# Patient Record
Sex: Female | Born: 1945 | Race: White | Hispanic: No | Marital: Single | Smoking: Never smoker
Health system: Southern US, Community
[De-identification: ages and names within clinical notes are randomized; demographics above are authoritative.]

## PROBLEM LIST (undated history)

## (undated) DIAGNOSIS — I10 Essential (primary) hypertension: Secondary | ICD-10-CM

## (undated) HISTORY — PX: ABDOMINAL HYSTERECTOMY: SHX81

---

## 2012-11-30 ENCOUNTER — Emergency Department: Payer: Self-pay | Admitting: Emergency Medicine

## 2012-11-30 LAB — BASIC METABOLIC PANEL
Anion Gap: 4 — ABNORMAL LOW (ref 7–16)
BUN: 16 mg/dL (ref 7–18)
Calcium, Total: 9.5 mg/dL (ref 8.5–10.1)
Creatinine: 0.86 mg/dL (ref 0.60–1.30)
EGFR (African American): >60
EGFR (Non-African Amer.): >60
Sodium: 141 mmol/L (ref 136–145)

## 2012-11-30 LAB — CBC
MCH: 30 pg (ref 26.0–34.0)
MCHC: 33 g/dL (ref 32.0–36.0)
MCV: 91 fL (ref 80–100)

## 2013-12-22 ENCOUNTER — Emergency Department: Payer: Self-pay | Admitting: Emergency Medicine

## 2013-12-22 LAB — URINALYSIS, COMPLETE
BILIRUBIN, UR: NEGATIVE
Blood: NEGATIVE
GLUCOSE, UR: NEGATIVE mg/dL (ref 0–75)
Ketone: NEGATIVE
LEUKOCYTE ESTERASE: NEGATIVE
Nitrite: NEGATIVE
Ph: 5 (ref 4.5–8.0)
Protein: NEGATIVE
RBC,UR: NONE SEEN /HPF (ref 0–5)
Specific Gravity: 1.016 (ref 1.003–1.030)
WBC UR: 9 /HPF (ref 0–5)

## 2013-12-22 LAB — COMPREHENSIVE METABOLIC PANEL
AST: 12 U/L — AB (ref 15–37)
Albumin: 3.6 g/dL (ref 3.4–5.0)
Alkaline Phosphatase: 50 U/L
Anion Gap: 3 — ABNORMAL LOW (ref 7–16)
BILIRUBIN TOTAL: 0.3 mg/dL (ref 0.2–1.0)
BUN: 12 mg/dL (ref 7–18)
CO2: 30 mmol/L (ref 21–32)
CREATININE: 0.96 mg/dL (ref 0.60–1.30)
Calcium, Total: 9.4 mg/dL (ref 8.5–10.1)
Chloride: 110 mmol/L — ABNORMAL HIGH (ref 98–107)
EGFR (African American): >60
Glucose: 109 mg/dL — ABNORMAL HIGH (ref 65–99)
OSMOLALITY: 285 (ref 275–301)
Potassium: 4.3 mmol/L (ref 3.5–5.1)
SGPT (ALT): 26 U/L
SODIUM: 143 mmol/L (ref 136–145)
Total Protein: 6.8 g/dL (ref 6.4–8.2)

## 2013-12-22 LAB — CBC WITH DIFFERENTIAL/PLATELET
Basophil #: 0.1 10*3/uL (ref 0.0–0.1)
Basophil %: 0.8 %
EOS ABS: 0.3 10*3/uL (ref 0.0–0.7)
EOS PCT: 2.7 %
HCT: 46.8 % (ref 35.0–47.0)
HGB: 15.5 g/dL (ref 12.0–16.0)
Lymphocyte #: 2.9 10*3/uL (ref 1.0–3.6)
Lymphocyte %: 27.6 %
MCH: 31.2 pg (ref 26.0–34.0)
MCHC: 33.2 g/dL (ref 32.0–36.0)
MCV: 94 fL (ref 80–100)
MONO ABS: 1.1 x10 3/mm — AB (ref 0.2–0.9)
Monocyte %: 10.2 %
Neutrophil #: 6.1 10*3/uL (ref 1.4–6.5)
Neutrophil %: 58.7 %
Platelet: 288 10*3/uL (ref 150–440)
RBC: 4.98 10*6/uL (ref 3.80–5.20)
RDW: 14.5 % (ref 11.5–14.5)
WBC: 10.4 10*3/uL (ref 3.6–11.0)

## 2014-03-07 ENCOUNTER — Ambulatory Visit: Payer: Self-pay | Admitting: Internal Medicine

## 2014-03-20 ENCOUNTER — Ambulatory Visit: Payer: Self-pay | Admitting: Internal Medicine

## 2014-08-23 ENCOUNTER — Other Ambulatory Visit: Payer: Self-pay | Admitting: Internal Medicine

## 2014-08-23 DIAGNOSIS — R920 Mammographic microcalcification found on diagnostic imaging of breast: Secondary | ICD-10-CM

## 2014-09-19 ENCOUNTER — Ambulatory Visit: Admission: RE | Admit: 2014-09-19 | Payer: Medicaid Other | Source: Ambulatory Visit

## 2014-09-19 ENCOUNTER — Inpatient Hospital Stay: Admission: RE | Admit: 2014-09-19 | Payer: Medicaid Other | Source: Ambulatory Visit

## 2014-09-19 ENCOUNTER — Inpatient Hospital Stay: Admission: RE | Admit: 2014-09-19 | Payer: Self-pay | Source: Ambulatory Visit

## 2014-09-19 ENCOUNTER — Other Ambulatory Visit: Payer: Self-pay | Admitting: Internal Medicine

## 2014-09-19 ENCOUNTER — Other Ambulatory Visit: Payer: Self-pay

## 2014-09-19 DIAGNOSIS — R920 Mammographic microcalcification found on diagnostic imaging of breast: Secondary | ICD-10-CM

## 2014-10-02 ENCOUNTER — Ambulatory Visit
Admission: RE | Admit: 2014-10-02 | Discharge: 2014-10-02 | Disposition: A | Discharge status: Home / Self Care | Payer: Medicare Other | Source: Ambulatory Visit | Attending: Internal Medicine | Admitting: Internal Medicine

## 2014-10-02 DIAGNOSIS — N63 Unspecified lump in breast: Secondary | ICD-10-CM | POA: Insufficient documentation

## 2014-10-02 DIAGNOSIS — R920 Mammographic microcalcification found on diagnostic imaging of breast: Secondary | ICD-10-CM

## 2014-10-16 ENCOUNTER — Other Ambulatory Visit: Payer: Self-pay | Admitting: Internal Medicine

## 2014-10-16 DIAGNOSIS — N6320 Unspecified lump in the left breast, unspecified quadrant: Secondary | ICD-10-CM

## 2014-10-16 DIAGNOSIS — N6310 Unspecified lump in the right breast, unspecified quadrant: Secondary | ICD-10-CM

## 2014-10-23 ENCOUNTER — Other Ambulatory Visit: Payer: Self-pay | Admitting: Internal Medicine

## 2014-10-23 ENCOUNTER — Ambulatory Visit: Payer: Medicare Other

## 2014-10-23 ENCOUNTER — Ambulatory Visit
Admission: RE | Admit: 2014-10-23 | Discharge: 2014-10-23 | Disposition: A | Discharge status: Home / Self Care | Payer: Medicare Other | Source: Ambulatory Visit | Attending: Internal Medicine | Admitting: Internal Medicine

## 2014-10-23 DIAGNOSIS — N6320 Unspecified lump in the left breast, unspecified quadrant: Secondary | ICD-10-CM

## 2014-10-23 DIAGNOSIS — N6310 Unspecified lump in the right breast, unspecified quadrant: Secondary | ICD-10-CM

## 2014-10-29 ENCOUNTER — Emergency Department
Admission: EM | Admit: 2014-10-29 | Discharge: 2014-10-29 | Disposition: A | Discharge status: Home / Self Care | Payer: Medicare Other | Attending: Emergency Medicine | Admitting: Emergency Medicine

## 2014-10-29 ENCOUNTER — Emergency Department: Payer: Medicare Other

## 2014-10-29 ENCOUNTER — Encounter: Payer: Self-pay | Admitting: Emergency Medicine

## 2014-10-29 DIAGNOSIS — Y998 Other external cause status: Secondary | ICD-10-CM | POA: Diagnosis not present

## 2014-10-29 DIAGNOSIS — S4991XA Unspecified injury of right shoulder and upper arm, initial encounter: Secondary | ICD-10-CM | POA: Diagnosis present

## 2014-10-29 DIAGNOSIS — S8001XA Contusion of right knee, initial encounter: Secondary | ICD-10-CM | POA: Insufficient documentation

## 2014-10-29 DIAGNOSIS — I10 Essential (primary) hypertension: Secondary | ICD-10-CM | POA: Diagnosis not present

## 2014-10-29 DIAGNOSIS — S40011A Contusion of right shoulder, initial encounter: Secondary | ICD-10-CM | POA: Diagnosis not present

## 2014-10-29 DIAGNOSIS — Y9289 Other specified places as the place of occurrence of the external cause: Secondary | ICD-10-CM | POA: Insufficient documentation

## 2014-10-29 DIAGNOSIS — Y9389 Activity, other specified: Secondary | ICD-10-CM | POA: Diagnosis not present

## 2014-10-29 DIAGNOSIS — T07XXXA Unspecified multiple injuries, initial encounter: Secondary | ICD-10-CM

## 2014-10-29 HISTORY — DX: Essential (primary) hypertension: I10

## 2014-10-29 MED ORDER — HYDROCODONE-ACETAMINOPHEN 5-325 MG PO TABS: 1.0000 | ORAL_TABLET | ORAL | Status: AC | PRN

## 2014-10-29 MED ORDER — IBUPROFEN 800 MG PO TABS: 800.0000 mg | ORAL_TABLET | Freq: Three times a day (TID) | ORAL | Status: AC | PRN

## 2014-10-29 MED ORDER — CYCLOBENZAPRINE HCL 5 MG PO TABS: 5.0000 mg | ORAL_TABLET | Freq: Three times a day (TID) | ORAL | Status: AC | PRN

## 2014-10-29 NOTE — ED Notes (Signed)
Larita Fife form social Biomedical scientist is coming to see pt, per pt resquest
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
## 2014-10-29 NOTE — ED Notes (Signed)
Case worker gave pt taxi voucher

## 2014-10-29 NOTE — Discharge Instructions (Signed)
Assault, General °Assault includes any behavior, whether intentional or reckless, which results in bodily injury to another person and/or damage to property. Included in this would be any behavior, intentional or reckless, that by its nature would be understood (interpreted) by a reasonable person as intent to harm another person or to damage his/her property. Threats may be oral or written. They may be communicated through regular mail, computer, fax, or phone. These threats may be direct or implied. °FORMS OF ASSAULT INCLUDE: °· Physically assaulting a person. This includes physical threats to inflict physical harm as well as: °· Slapping. °· Hitting. °· Poking. °· Kicking. °· Punching. °· Pushing. °· Arson. °· Sabotage. °· Equipment vandalism. °· Damaging or destroying property. °· Throwing or hitting objects. °· Displaying a weapon or an object that appears to be a weapon in a threatening manner. °· Carrying a firearm of any kind. °· Using a weapon to harm someone. °· Using greater physical size/strength to intimidate another. °· Making intimidating or threatening gestures. °· Bullying. °· Hazing. °· Intimidating, threatening, hostile, or abusive language directed toward another person. °· It communicates the intention to engage in violence against that person. And it leads a reasonable person to expect that violent behavior may occur. °· Stalking another person. °IF IT HAPPENS AGAIN: °· Immediately call for emergency help (911 in [place] °· If someone poses clear and immediate danger to you, seek legal authorities to have a protective or restraining order put in place. °· Less threatening assaults can at least be reported to authorities. °STEPS TO TAKE IF A SEXUAL ASSAULT HAS HAPPENED °· Go to an area of safety. This may include a shelter or staying with a friend. Stay away from the area where you have been attacked. A large percentage of sexual assaults are caused by a friend, relative or associate. °· If  medications were given by your caregiver, take them as directed for the full length of time prescribed. °· Only take over-the-counter or prescription medicines for pain, discomfort, or fever as directed by your caregiver. °· If you have come in contact with a sexual disease, find out if you are to be tested again. If your caregiver is concerned about the HIV/AIDS virus, he/she may require you to have continued testing for several months. °· For the protection of your privacy, test results can not be given over the phone. Make sure you receive the results of your test. If your test results are not back during your visit, make an appointment with your caregiver to find out the results. Do not assume everything is normal if you have not heard from your caregiver or the medical facility. It is important for you to follow up on all of your test results. °· File appropriate papers with authorities. This is important in all assaults, even if it has occurred in a family or by a friend. °SEEK MEDICAL CARE IF: °· You have new problems because of your injuries. °· You have problems that may be because of the medicine you are taking, such as: °· Rash. °· Itching. °· Swelling. °· Trouble breathing. °· You develop belly (abdominal) pain, feel sick to your stomach (nausea) or are vomiting. °· You begin to run a temperature. °· You need supportive care or referral to a rape crisis center. These are centers with trained personnel who can help you get through this ordeal. °SEEK IMMEDIATE MEDICAL CARE IF: °· You are afraid of being threatened, beaten, or abused. In [place] call 911. °· You   receive new injuries related to abuse. °· You develop severe pain in any area injured in the assault or have any change in your condition that concerns you. °· You faint or lose consciousness. °· You develop chest pain or shortness of breath. °Document Released: 04/26/2005 Document Revised: 07/19/2011 Document Reviewed: 12/13/2007 °ExitCare® Patient  Information ©2015 [employer]. This information is not intended to replace advice given to you by your health care provider. Make sure you discuss any questions you have with your health care provider. ° °Contusion °A contusion is a deep bruise. Contusions are the result of an injury that caused bleeding under the skin. The contusion may turn blue, purple, or yellow. Minor injuries will give you a painless contusion, but more severe contusions may stay painful and swollen for a few weeks.  °CAUSES  °A contusion is usually caused by a blow, trauma, or direct force to an area of the body. °SYMPTOMS  °· Swelling and redness of the injured area. °· Bruising of the injured area. °· Tenderness and soreness of the injured area. °· Pain. °DIAGNOSIS  °The diagnosis can be made by taking a history and physical exam. An X-ray, CT scan, or MRI may be needed to determine if there were any associated injuries, such as fractures. °TREATMENT  °Specific treatment will depend on what area of the body was injured. In general, the best treatment for a contusion is resting, icing, elevating, and applying cold compresses to the injured area. Over-the-counter medicines may also be recommended for pain control. Ask your caregiver what the best treatment is for your contusion. °HOME CARE INSTRUCTIONS  °· Put ice on the injured area. °¨ Put ice in a plastic bag. °¨ Place a towel between your skin and the bag. °¨ Leave the ice on for 15-20 minutes, 3-4 times a day, or as directed by your health care provider. °· Only take over-the-counter or prescription medicines for pain, discomfort, or fever as directed by your caregiver. Your caregiver may recommend avoiding anti-inflammatory medicines (aspirin, ibuprofen, and naproxen) for 48 hours because these medicines may increase bruising. °· Rest the injured area. °· If possible, elevate the injured area to reduce swelling. °SEEK IMMEDIATE MEDICAL CARE IF:  °· You have increased bruising or  swelling. °· You have pain that is getting worse. °· Your swelling or pain is not relieved with medicines. °MAKE SURE YOU:  °· Understand these instructions. °· Will watch your condition. °· Will get help right away if you are not doing well or get worse. °Document Released: 02/03/2005 Document Revised: 05/01/2013 Document Reviewed: 03/01/2011 °ExitCare® Patient Information ©2015 [employer]. This information is not intended to replace advice given to you by your health care provider. Make sure you discuss any questions you have with your health care provider. ° °
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
## 2015-03-20 ENCOUNTER — Other Ambulatory Visit: Payer: Self-pay | Admitting: Internal Medicine

## 2015-03-20 DIAGNOSIS — N63 Unspecified lump in unspecified breast: Secondary | ICD-10-CM

## 2015-04-16 ENCOUNTER — Ambulatory Visit
Admission: RE | Admit: 2015-04-16 | Discharge: 2015-04-16 | Disposition: A | Discharge status: Home / Self Care | Payer: Medicare Other | Source: Ambulatory Visit | Attending: Internal Medicine | Admitting: Internal Medicine

## 2015-04-16 ENCOUNTER — Other Ambulatory Visit: Payer: Self-pay | Admitting: Internal Medicine

## 2015-04-16 DIAGNOSIS — N63 Unspecified lump in unspecified breast: Secondary | ICD-10-CM

## 2016-12-26 IMAGING — US US BREAST LTD UNI RIGHT INC AXILLA
1 series · 7 of 7 positions shown · non-contrast
Comparison: 03/20/2014, 03/07/2014

CLINICAL DATA: 68-year-old female for follow-up of bilateral
probably benign breast masses

EXAM:
DIGITAL DIAGNOSTIC BILATERAL MAMMOGRAM WITH 3D TOMOSYNTHESIS WITH
CAD
ULTRASOUND BILATERAL BREAST

[Series 1: us breast ltd uni right inc axilla · 0.08mm/px · 7 of 7 slices shown]
[im 1/7]
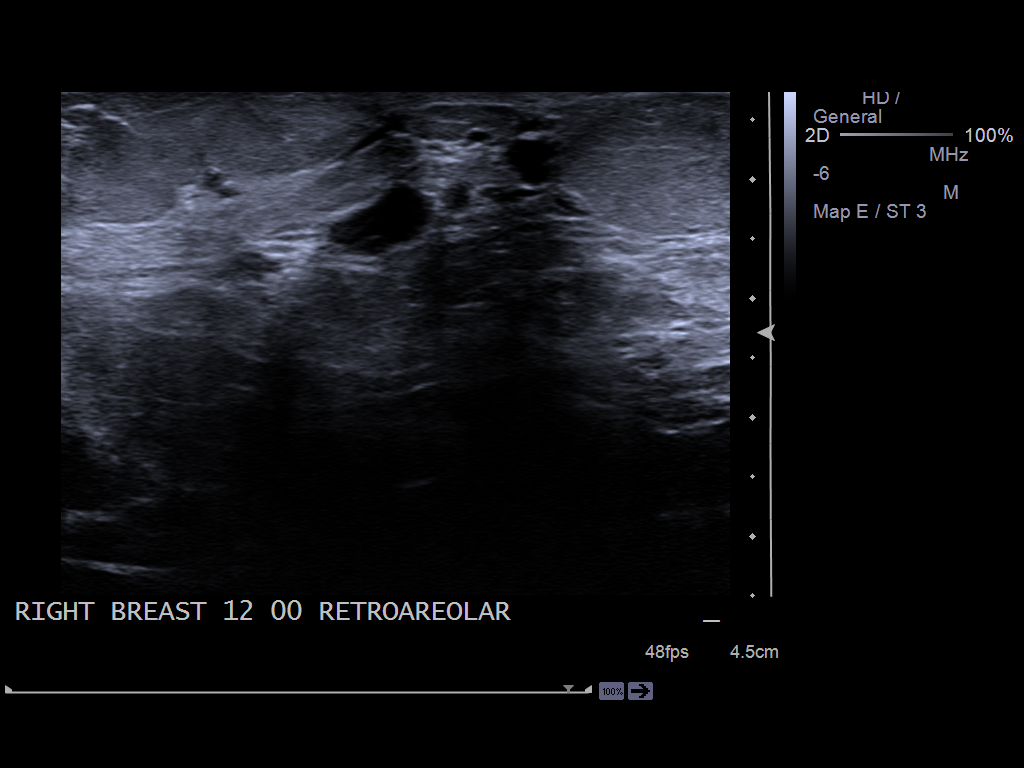
[im 2/7]
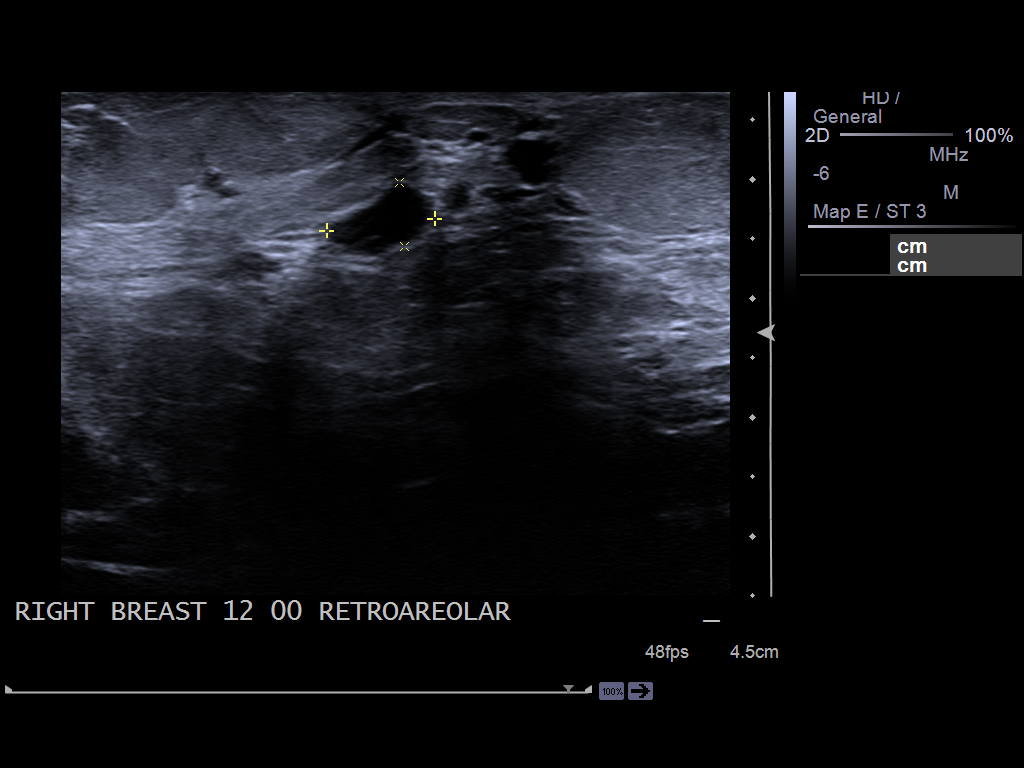
[im 3/7]
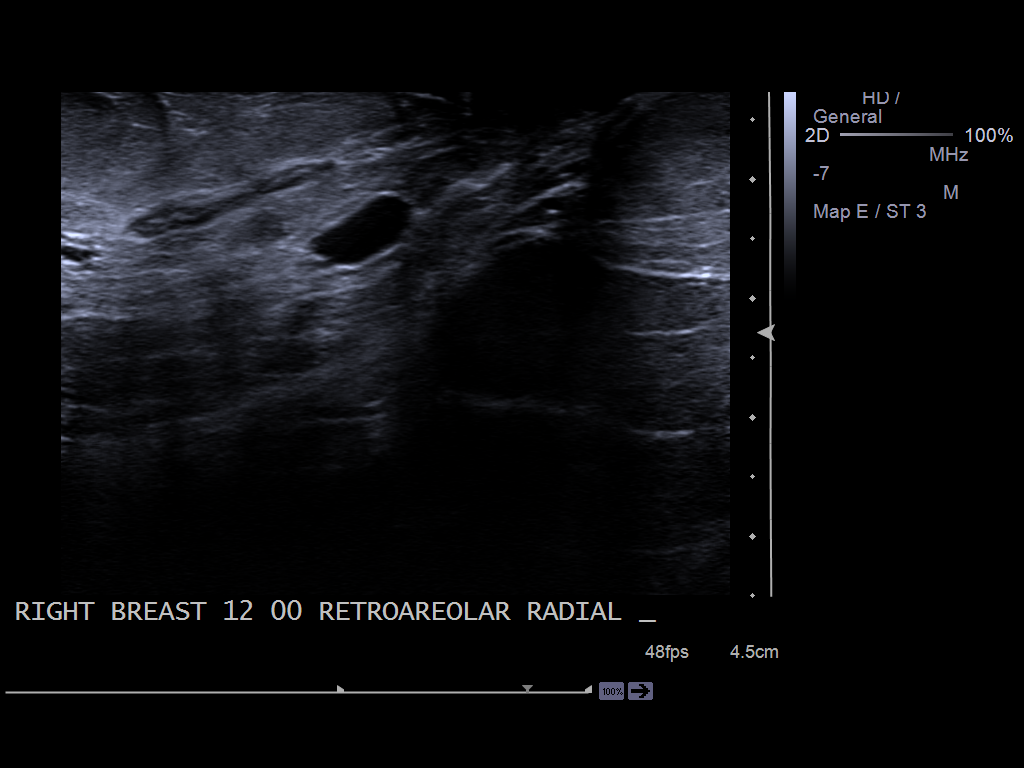
[im 4/7]
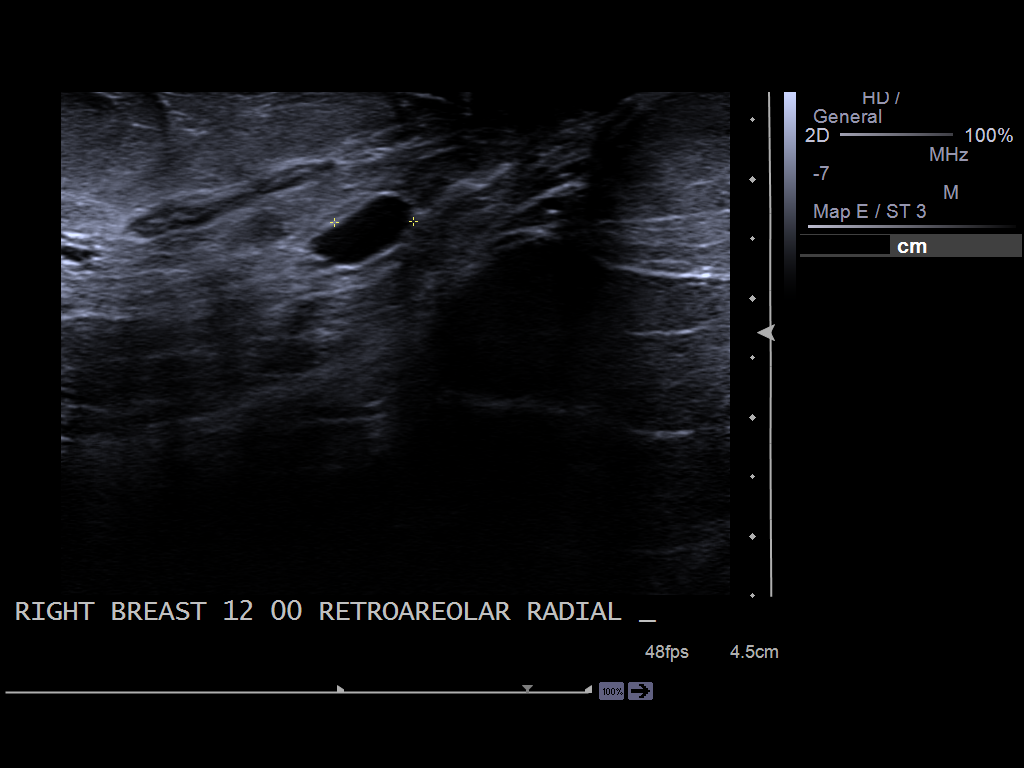
[im 5/7]
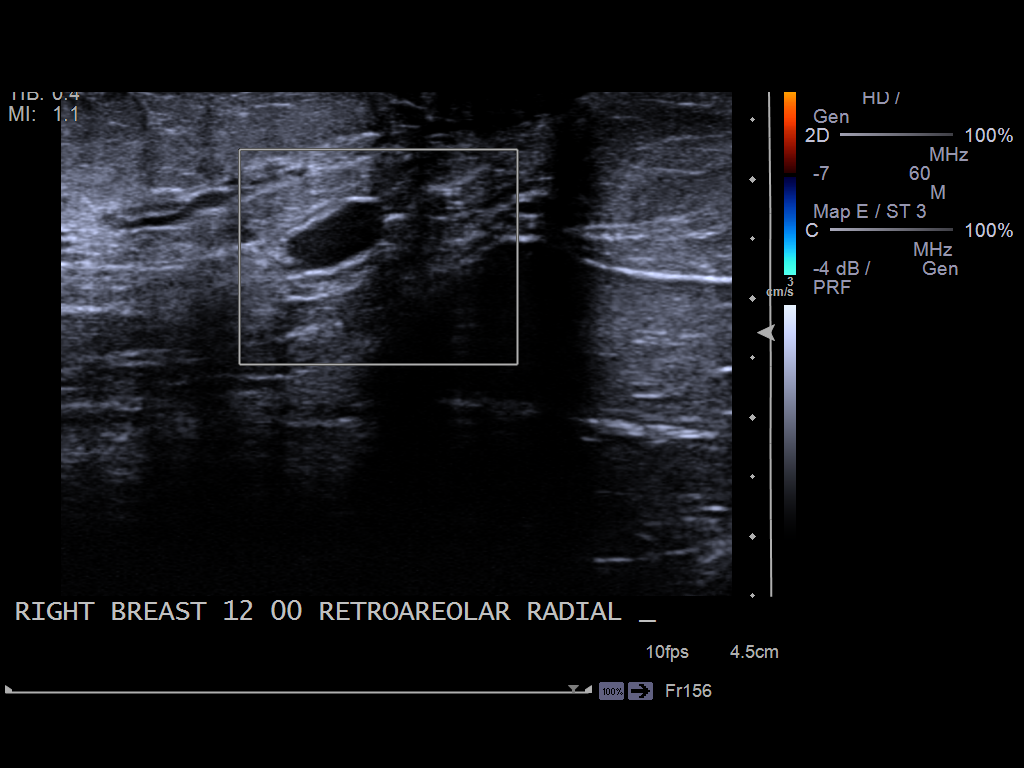
[im 6/7]
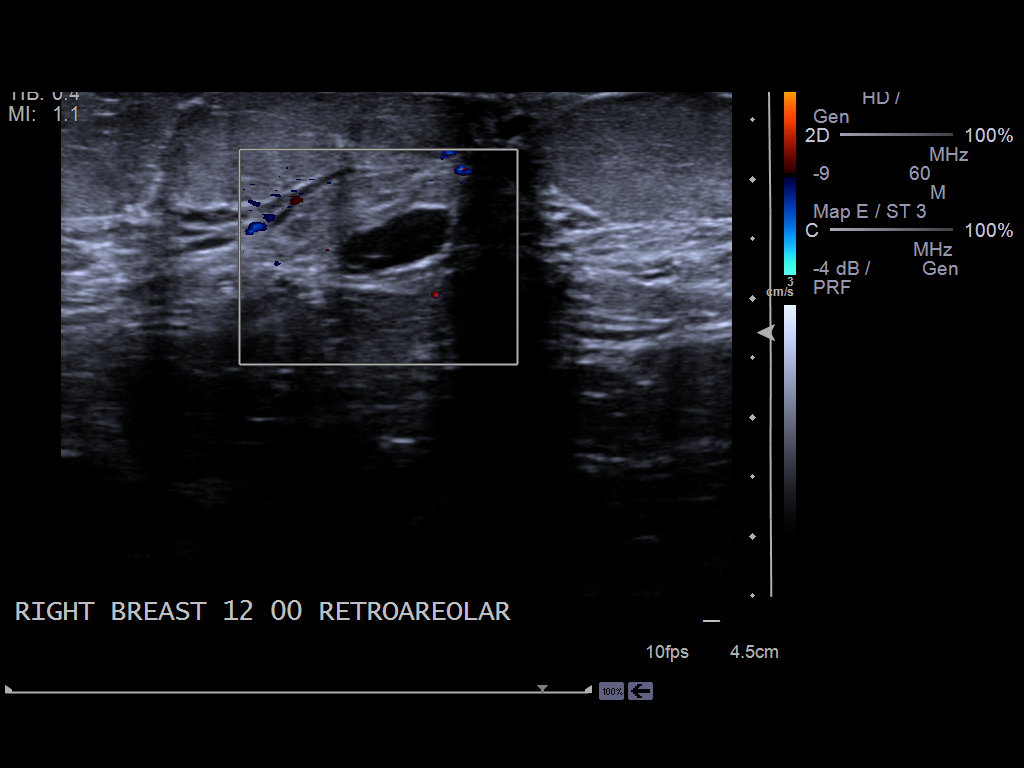
[im 7/7]
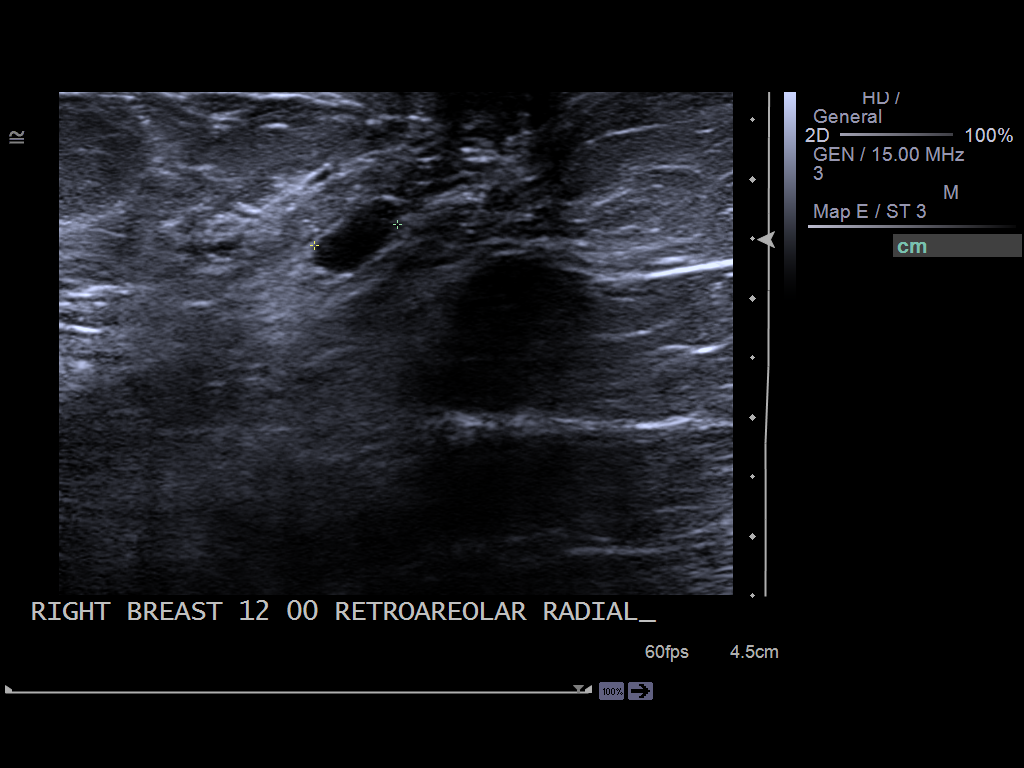

[7 of 7 positions shown; findings below may reference images not displayed]

ACR Breast Density Category c: The breast tissue is heterogeneously
dense, which may obscure small masses.
FINDINGS: The previously noted oval, circumscribed, subareolar right breast
mass is not significantly changed from prior study. The previously
noted oval, circumscribed mass within the slightly upper, outer left
breast, middle depth is also not significantly changed. No new or
suspicious abnormality.

Mammographic images were processed with CAD.

Targeted ultrasound is performed, showing a similar-appearing
circumscribed, oval, near anechoic mass at 12 o'clock, retroareolar
region measuring 9 x 5 x 7 mm, not significantly changed from prior
study. The previously noted circumscribed, oval, near anechoic mass
at 2 o'clock, 4 cm from the nipple is also not significantly changed
from prior study measuring 14 x 10 x 8 mm.
IMPRESSION: Probably benign bilateral breast findings.

RECOMMENDATION:
Bilateral diagnostic mammogram and possible ultrasound in 6 months.

I have discussed the findings and recommendations with the patient.
Results were also provided in writing at the conclusion of the
visit. If applicable, a reminder letter will be sent to the patient
regarding the next appointment.

BI-RADS CATEGORY  3: Probably benign.

## 2016-12-26 IMAGING — US US BREAST LTD UNI LEFT INC AXILLA
1 series · 6 of 6 positions shown · non-contrast
Comparison: 03/20/2014, 03/07/2014

CLINICAL DATA: 68-year-old female for follow-up of bilateral
probably benign breast masses

EXAM:
DIGITAL DIAGNOSTIC BILATERAL MAMMOGRAM WITH 3D TOMOSYNTHESIS WITH
CAD
ULTRASOUND BILATERAL BREAST

[Series 1: us breast ltd uni left inc axilla · 0.09mm/px · 6 of 6 slices shown]
[im 1/6]
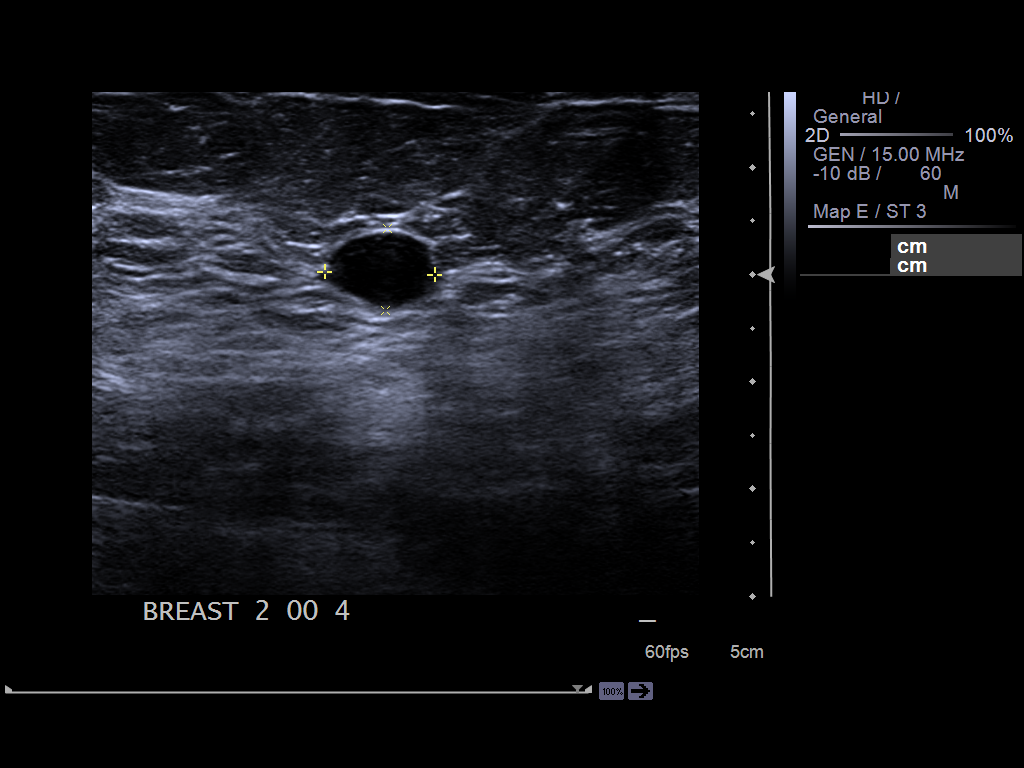
[im 2/6]
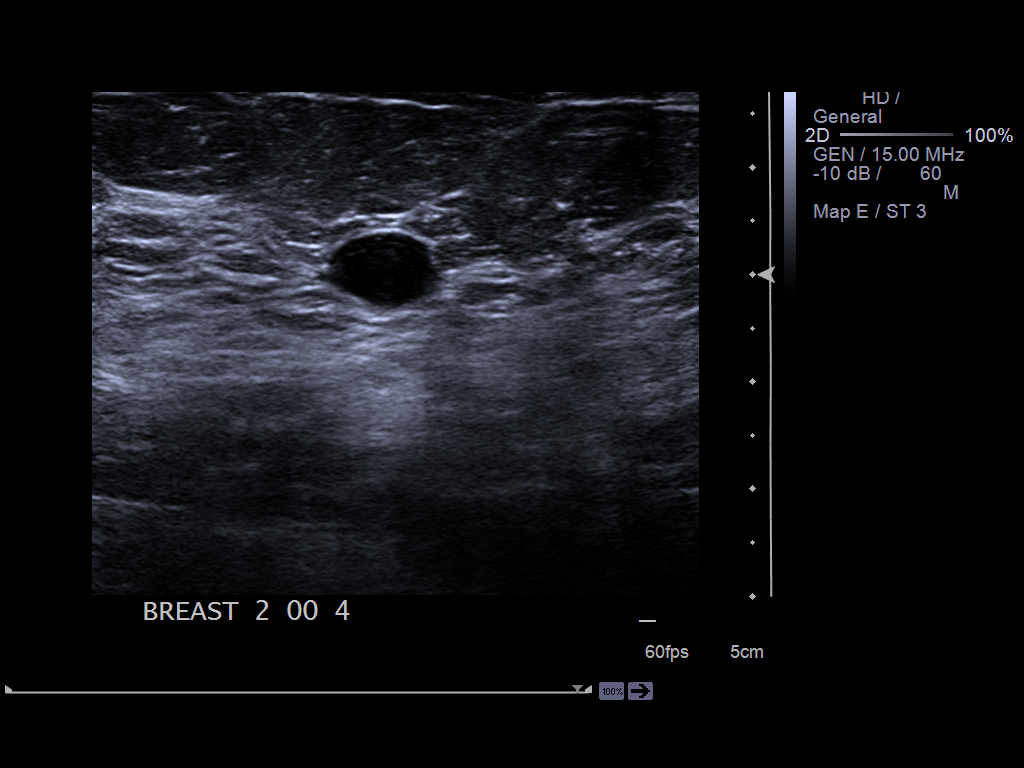
[im 3/6]
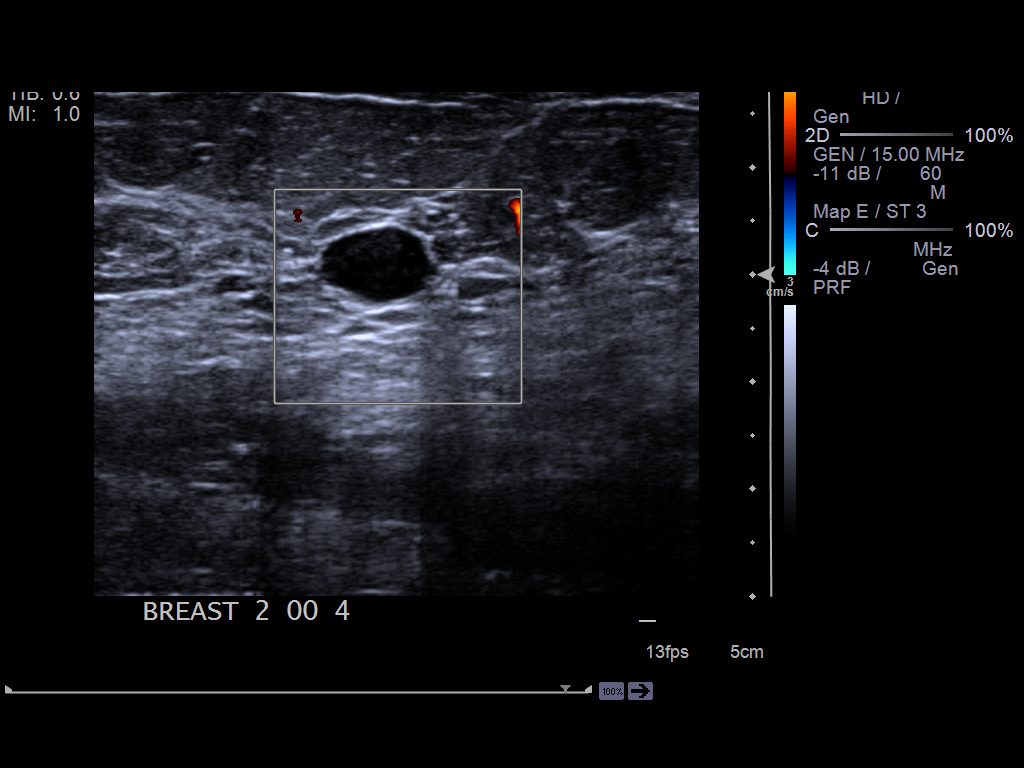
[im 4/6]
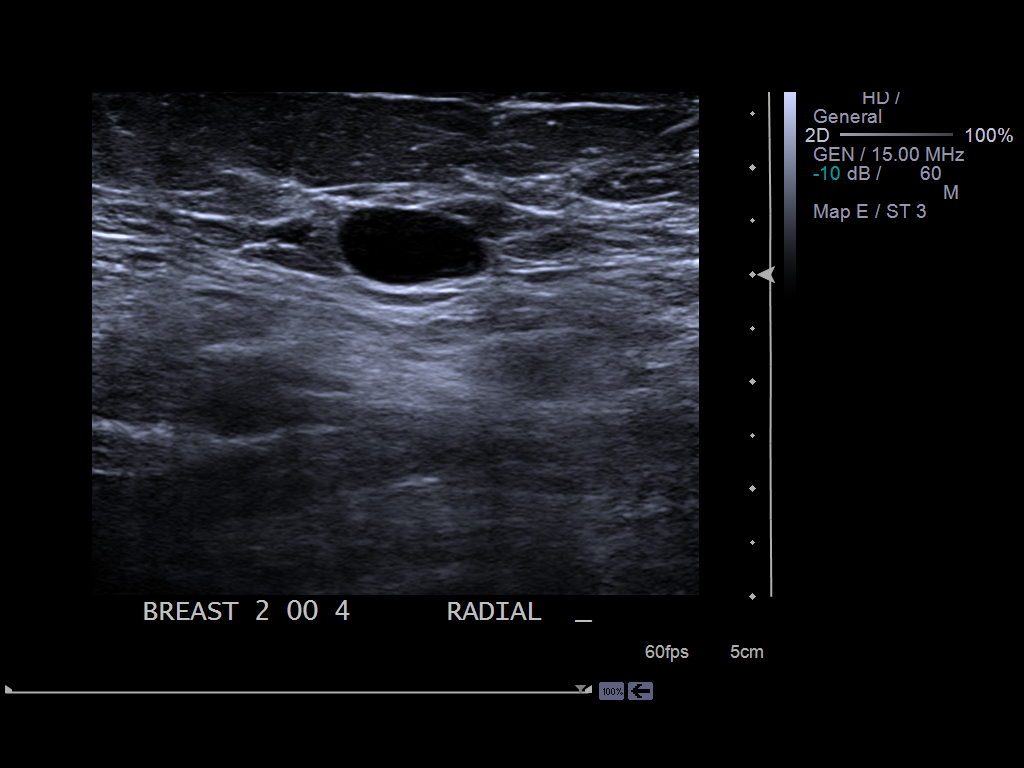
[im 5/6]
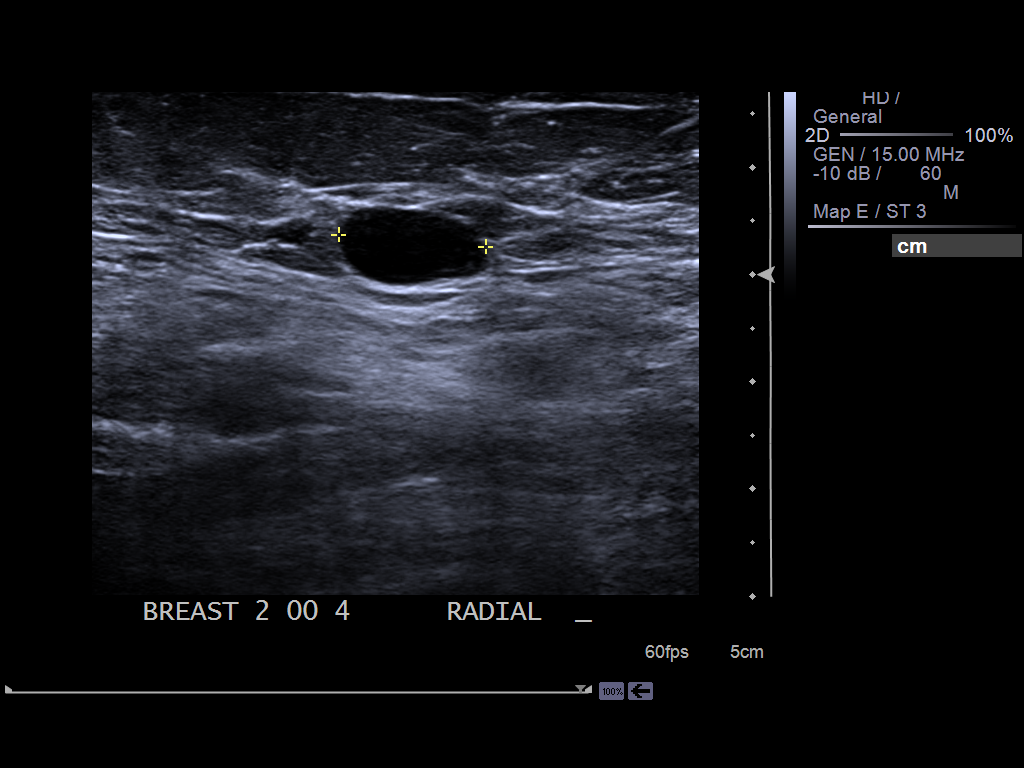
[im 6/6]
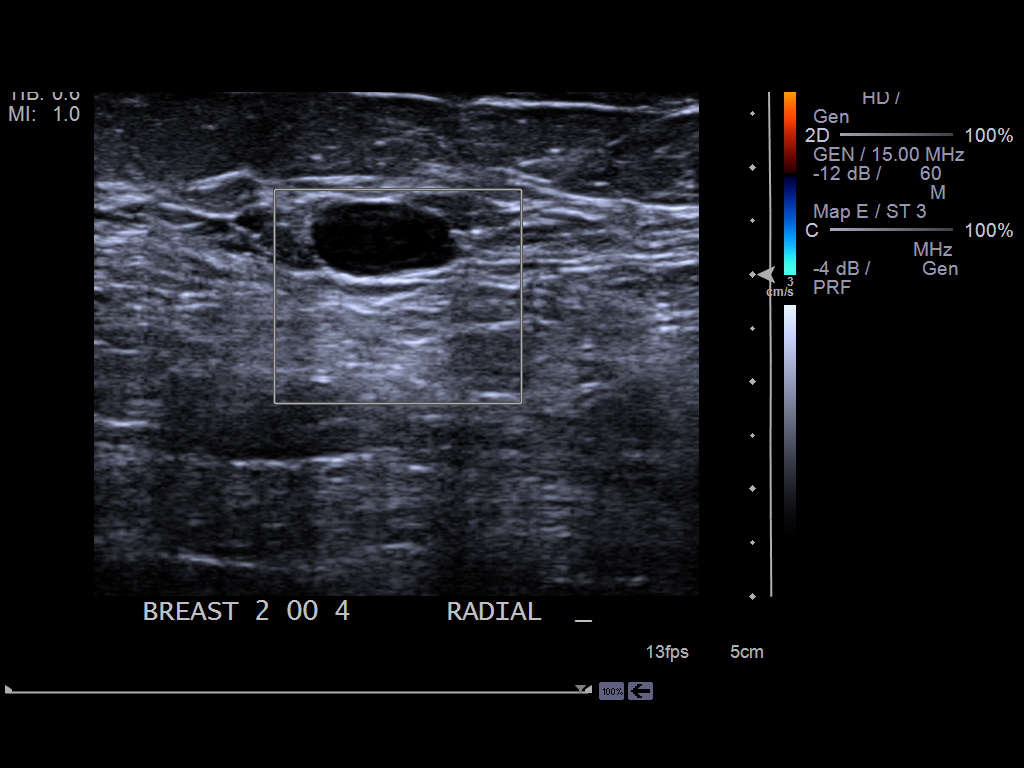

[6 of 6 positions shown; findings below may reference images not displayed]

ACR Breast Density Category c: The breast tissue is heterogeneously
dense, which may obscure small masses.
FINDINGS: The previously noted oval, circumscribed, subareolar right breast
mass is not significantly changed from prior study. The previously
noted oval, circumscribed mass within the slightly upper, outer left
breast, middle depth is also not significantly changed. No new or
suspicious abnormality.

Mammographic images were processed with CAD.

Targeted ultrasound is performed, showing a similar-appearing
circumscribed, oval, near anechoic mass at 12 o'clock, retroareolar
region measuring 9 x 5 x 7 mm, not significantly changed from prior
study. The previously noted circumscribed, oval, near anechoic mass
at 2 o'clock, 4 cm from the nipple is also not significantly changed
from prior study measuring 14 x 10 x 8 mm.
IMPRESSION: Probably benign bilateral breast findings.

RECOMMENDATION:
Bilateral diagnostic mammogram and possible ultrasound in 6 months.

I have discussed the findings and recommendations with the patient.
Results were also provided in writing at the conclusion of the
visit. If applicable, a reminder letter will be sent to the patient
regarding the next appointment.

BI-RADS CATEGORY  3: Probably benign.

## 2017-11-15 IMAGING — US US BREAST LTD UNI RIGHT INC AXILLA
1 series · 7 of 7 positions shown · non-contrast
Comparison: Previous exam(s).

CLINICAL DATA: 69-year-old female presenting for follow-up of
bilateral probably benign masses.

EXAM:
DIGITAL DIAGNOSTIC BILATERAL MAMMOGRAM WITH 3D TOMOSYNTHESIS AND CAD
RIGHT BREAST ULTRASOUND

[Series 1: us breast ltd uni right inc axilla · 0.05mm/px · 7 of 7 slices shown]
[im 1/7]
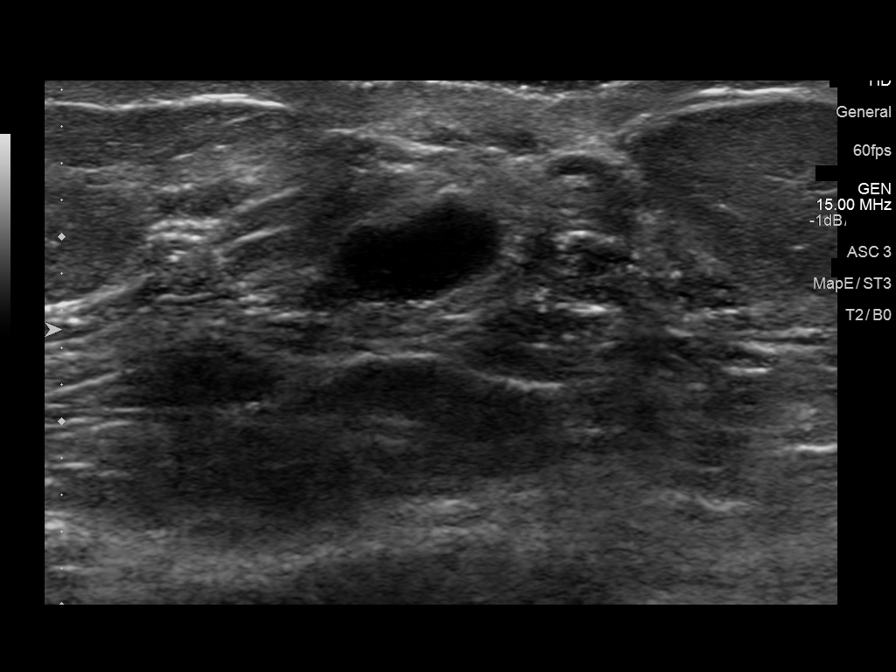
[im 2/7]
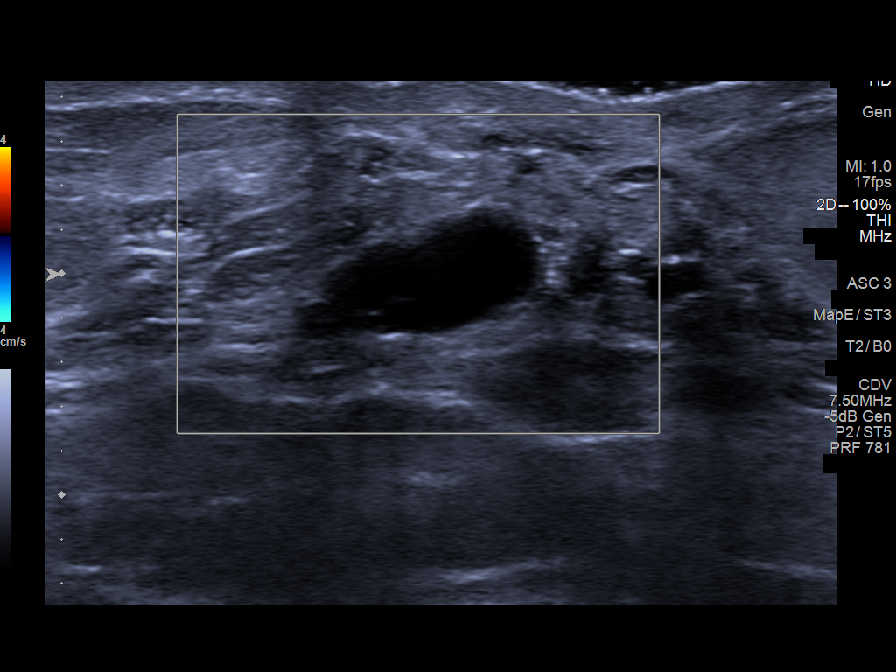
[im 3/7]
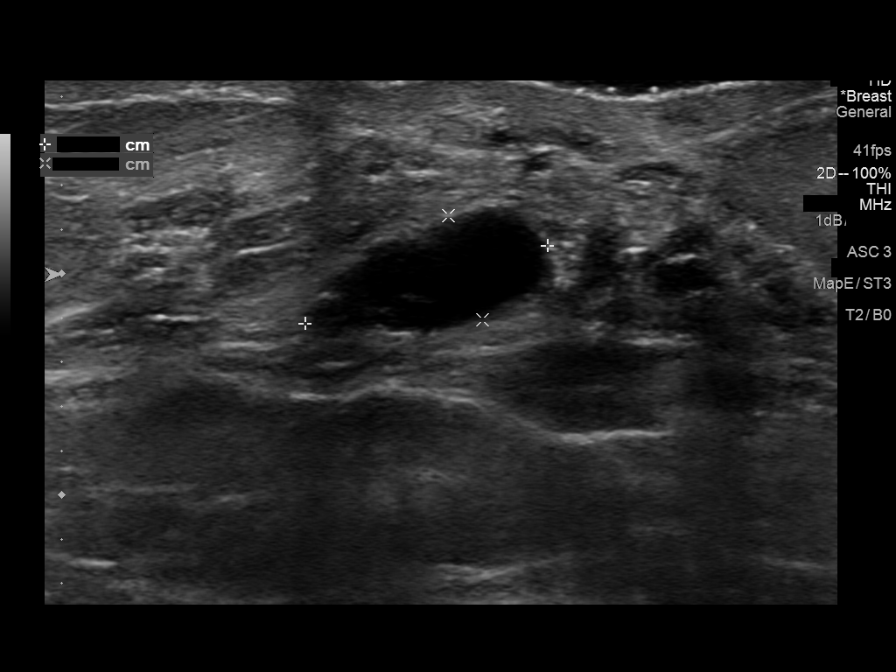
[im 4/7]
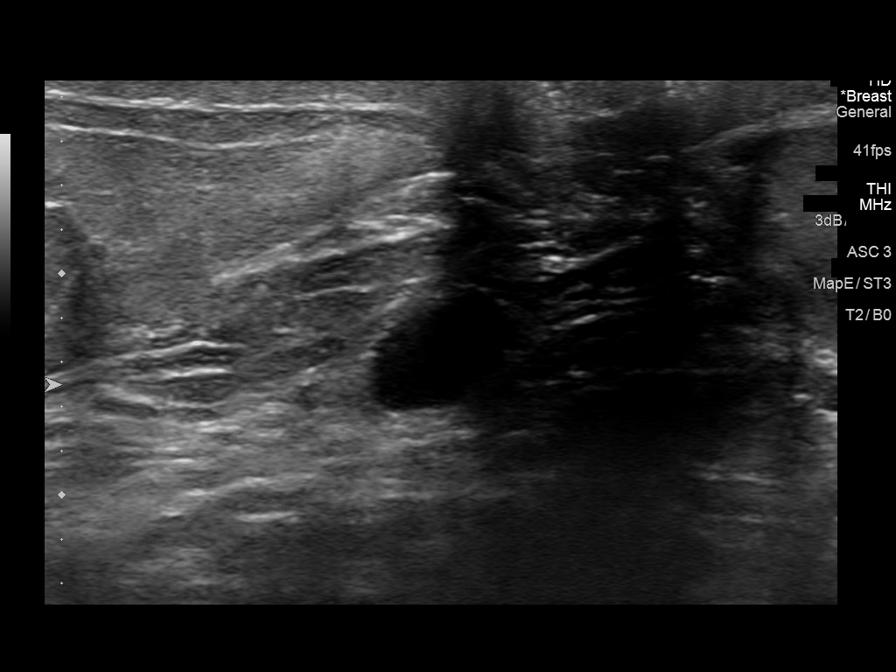
[im 5/7]
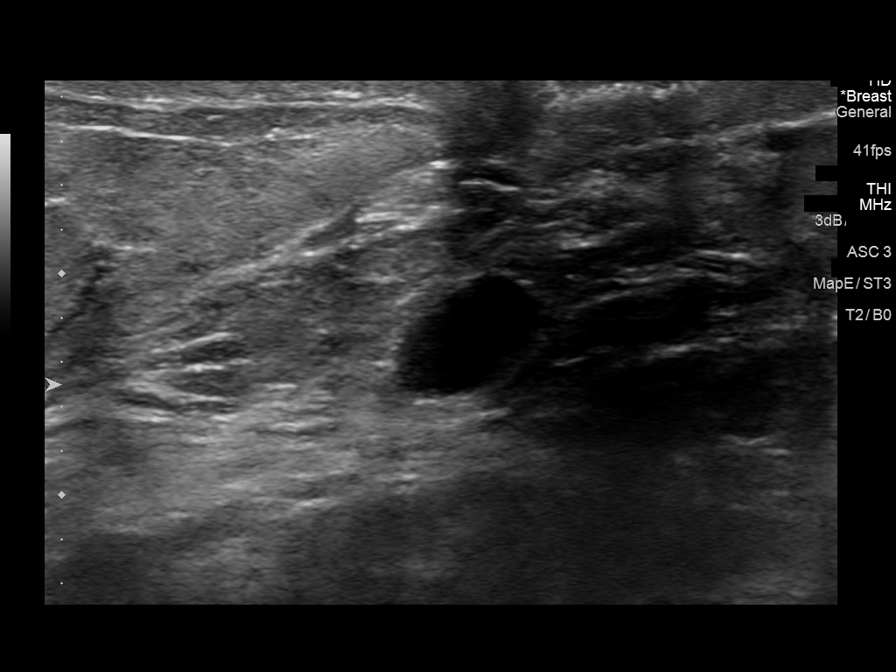
[im 6/7]
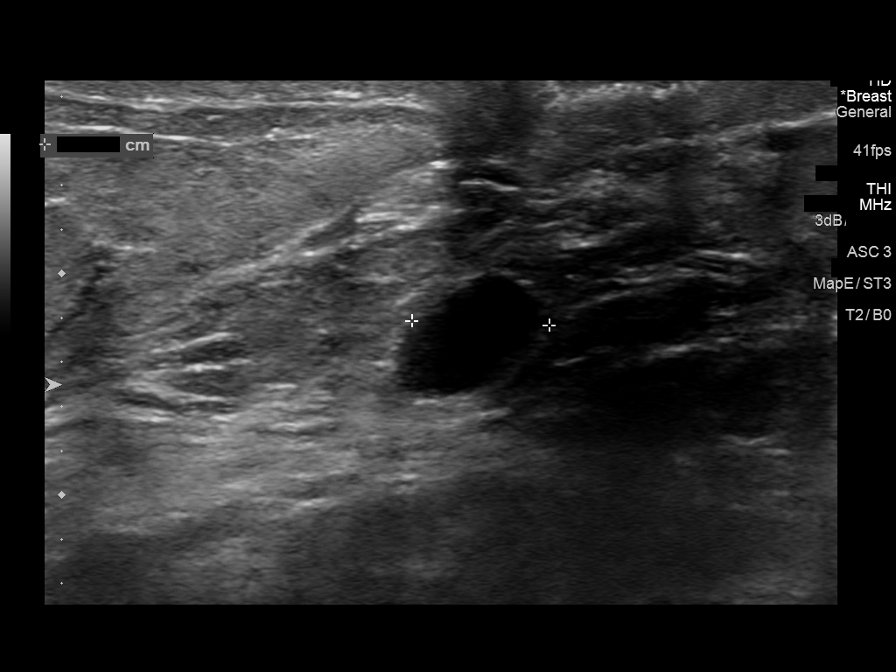
[im 7/7]
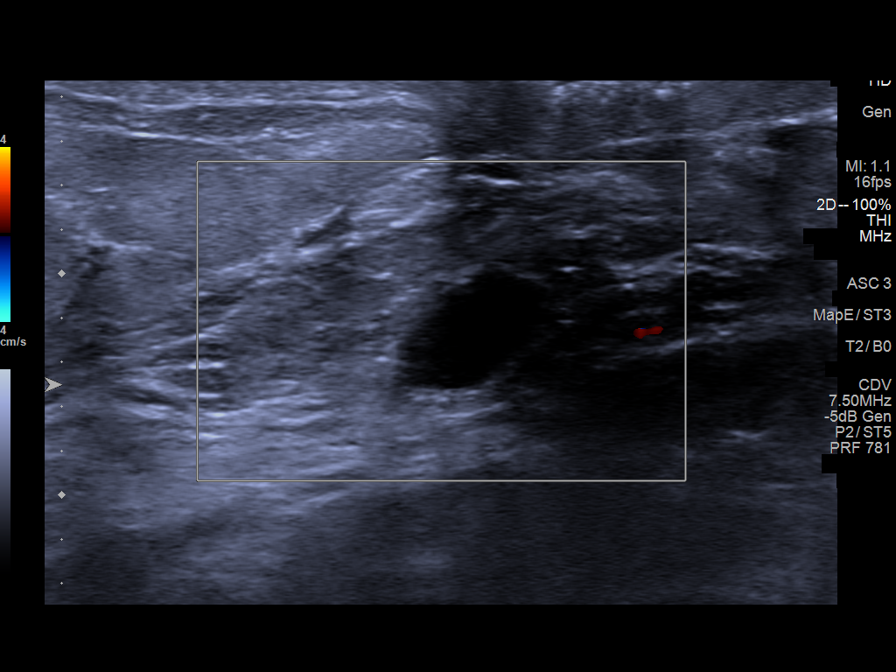

[7 of 7 positions shown; findings below may reference images not displayed]

ACR Breast Density Category b: There are scattered areas of
fibroglandular density.
FINDINGS: The probably benign mass in the upper-outer left breast has
decreased in size from the prior 2 exams, consistent with a benign
finding. The mass in the right breast at 12 o'clock, anterior depth
appears stable measuring approximately 8 mm mammographically. No new
suspicious calcifications, masses or areas of distortion are seen in
the bilateral breasts.

Mammographic images were processed with CAD.

No palpable masses identified in the subareolar right breast.

Ultrasound of the subareolar right breast at 12 o'clock demonstrates
an anechoic circumscribed mass with increased posterior acoustic
enhancement in the subareolar right breast imaged from 12 o'clock,
most compatible with a benign cyst. No filling defects are
identified. However, due to the subareolar location which is
challenging to image sonographically, continued follow-up will be
recommended.
IMPRESSION: 1. The mass in the left upper outer quadrant has decreased in size,
consistent with a benign finding, likely a cyst.

2. The mass in the subareolar right breast is not significantly
changed, most likely representing a cyst. However, due to the
subareolar location and difficulty in imaging sonographically, a a
12 month follow-up will be recommended.

RECOMMENDATION:
Diagnostic mammogram is suggested in 1 year, with possible right
breast ultrasound. (Code:[id number])

I have discussed the findings and recommendations with the patient.
Results were also provided in writing at the conclusion of the
visit. If applicable, a reminder letter will be sent to the patient
regarding the next appointment.

BI-RADS CATEGORY  3: Probably benign.
